# Patient Record
Sex: Female | Born: 1955 | Race: White | Hispanic: No | Marital: Single
Health system: Southern US, Community
[De-identification: ages and names within clinical notes are randomized; demographics above are authoritative.]

## PROBLEM LIST (undated history)

## (undated) DIAGNOSIS — R011 Cardiac murmur, unspecified: Secondary | ICD-10-CM

## (undated) DIAGNOSIS — T7840XA Allergy, unspecified, initial encounter: Secondary | ICD-10-CM

## (undated) DIAGNOSIS — E079 Disorder of thyroid, unspecified: Secondary | ICD-10-CM

## (undated) DIAGNOSIS — E785 Hyperlipidemia, unspecified: Secondary | ICD-10-CM

## (undated) HISTORY — DX: Cardiac murmur, unspecified: R01.1

## (undated) HISTORY — DX: Allergy, unspecified, initial encounter: T78.40XA

## (undated) HISTORY — DX: Hyperlipidemia, unspecified: E78.5

## (undated) HISTORY — DX: Disorder of thyroid, unspecified: E07.9

---

## 2020-07-19 ENCOUNTER — Other Ambulatory Visit: Payer: Self-pay

## 2020-07-19 ENCOUNTER — Ambulatory Visit: Payer: Self-pay | Admitting: Gerontology

## 2020-07-19 ENCOUNTER — Other Ambulatory Visit: Payer: Self-pay | Admitting: Gerontology

## 2020-07-19 ENCOUNTER — Encounter: Payer: Self-pay | Admitting: Gerontology

## 2020-07-19 VITALS — BP 140/62 | HR 74 | Resp 16 | Wt 143.7 lb

## 2020-07-19 DIAGNOSIS — Z7689 Persons encountering health services in other specified circumstances: Secondary | ICD-10-CM

## 2020-07-19 DIAGNOSIS — Z8639 Personal history of other endocrine, nutritional and metabolic disease: Secondary | ICD-10-CM | POA: Insufficient documentation

## 2020-07-19 MED ORDER — LEVOTHYROXINE SODIUM 50 MCG PO TABS: 50.0000 ug | ORAL_TABLET | Freq: Every day | ORAL | 2 refills | Status: DC

## 2020-07-19 MED ORDER — ATORVASTATIN CALCIUM 20 MG PO TABS: 20.0000 mg | ORAL_TABLET | Freq: Every day | ORAL | 2 refills | Status: DC

## 2020-07-19 NOTE — Patient Instructions (Signed)

## 2020-07-19 NOTE — Progress Notes (Signed)
New Patient Office Visit  Subjective:  Patient ID: Karen Savage, female    DOB: [birth year]  Age: 65 y.o. MRN: [id number]  CC: No chief complaint on file.   HPI Karen Savage presents to establish care and evaluation of her chronic condition.  She relocated to [facility] from [place] and has not had medical care since one year.She has a history of Hypothyroidism and takes 50 mcg of Levothyroxine. She denies cold/heat intolerance, constipation and weight gain. She also states that she is Prediabetic and continues to make healthy lifestyle changes. She also has a history of hyperlipidemia and takes 20 mg of Atorvastatin. She has not had mammogram, pap smear no colonoscopy done. She denies chest pain, palpitation, light headedness, and shortness of breath. Overall, she states that she's doing well and offers no further complaint.  Past Medical History:  Diagnosis Date  . Allergy   . Heart murmur   . Hyperlipidemia   . Thyroid disease       No family history on file.  Social History   Socioeconomic History  . Marital status: Single    Spouse name: Not on file  . Number of children: Not on file  . Years of education: Not on file  . Highest education level: Not on file  Occupational History  . Not on file  Tobacco Use  . Smoking status: Not on file  . Smokeless tobacco: Not on file  Substance and Sexual Activity  . Alcohol use: Not on file  . Drug use: Not on file  . Sexual activity: Not on file  Other Topics Concern  . Not on file  Social History Narrative  . Not on file   Social Determinants of Health   Financial Resource Strain: Not on file  Food Insecurity: Not on file  Transportation Needs: Not on file  Physical Activity: Not on file  Stress: Not on file  Social Connections: Not on file  Intimate Partner Violence: Not on file    ROS Review of Systems  Constitutional: Negative.   HENT: Negative.   Eyes: Negative.   Respiratory: Negative.   Cardiovascular: Negative.    Gastrointestinal: Negative.   Endocrine: Negative.   Genitourinary: Negative.   Musculoskeletal: Negative.   Skin: Negative.   Neurological: Negative.   Hematological: Negative.   Psychiatric/Behavioral: Negative.     Objective:   Today's Vitals: BP 140/62 (BP Location: Left Arm, Patient Position: Sitting, Cuff Size: Large)   Pulse 74   Resp 16   Wt 143 lb 11.2 oz (65.2 kg)   SpO2 100%   Physical Exam Constitutional:      Appearance: Normal appearance.  HENT:     Head: Normocephalic and atraumatic.     Nose: Nose normal.     Mouth/Throat:     Mouth: Mucous membranes are moist.  Eyes:     Extraocular Movements: Extraocular movements intact.     Conjunctiva/sclera: Conjunctivae normal.     Pupils: Pupils are equal, round, and reactive to light.  Cardiovascular:     Rate and Rhythm: Normal rate and regular rhythm.     Pulses: Normal pulses.     Heart sounds: Normal heart sounds.  Pulmonary:     Effort: Pulmonary effort is normal.     Breath sounds: Normal breath sounds.  Abdominal:     General: Abdomen is flat. Bowel sounds are normal.     Palpations: Abdomen is soft.  Genitourinary:    Comments: Deferred per patient Musculoskeletal:  General: Normal range of motion.     Cervical back: Normal range of motion.  Skin:    General: Skin is warm.  Neurological:     General: No focal deficit present.     Mental Status: She is alert and oriented to person, place, and time. Mental status is at baseline.  Psychiatric:        Mood and Affect: Mood normal.        Behavior: Behavior normal.        Thought Content: Thought content normal.        Judgment: Judgment normal.     Assessment & Plan:   1. Encounter to establish care -Routine labs will be checked - Comp Met (CMET) - CBC w/Diff - HgB A1c - TSH - Urinalysis - Lipid Profile  2. History of hypothyroidism - She will continue Levothyroxine 50 mcg pending TSH result. - levothyroxine (SYNTHROID) 50 MCG  tablet; Take 1 tablet (50 mcg total) by mouth daily before breakfast.  Dispense: 30 tablet; Refill: 2 - Urinalysis - Lipid Profile  3. History of hyperlipidemia - She will continue current medication pending Lipid pane, was encouraged to continue on low fat/cholesterol diet and exercise as tolerated. - atorvastatin (LIPITOR) 20 MG tablet; Take 1 tablet (20 mg total) by mouth daily.  Dispense: 30 tablet; Refill: 2 - Urinalysis - Lipid Profile     Follow-up: Return in about 15 days (around 08/03/2020), or if symptoms worsen or fail to improve.   Simone Rodenbeck Jerold Coombe, NP

## 2020-07-20 LAB — CBC WITH DIFFERENTIAL/PLATELET
Basophils Absolute: 0 10*3/uL (ref 0.0–0.2)
Basos: 0 %
EOS (ABSOLUTE): 0.1 10*3/uL (ref 0.0–0.4)
Eos: 1 %
Hematocrit: 41.7 % (ref 34.0–46.6)
Hemoglobin: 14.2 g/dL (ref 11.1–15.9)
Immature Grans (Abs): 0 10*3/uL (ref 0.0–0.1)
Immature Granulocytes: 0 %
Lymphocytes Absolute: 1.9 10*3/uL (ref 0.7–3.1)
Lymphs: 29 %
MCH: 32 pg (ref 26.6–33.0)
MCHC: 34.1 g/dL (ref 31.5–35.7)
MCV: 94 fL (ref 79–97)
Monocytes Absolute: 0.5 10*3/uL (ref 0.1–0.9)
Monocytes: 7 %
Neutrophils Absolute: 4.2 10*3/uL (ref 1.4–7.0)
Neutrophils: 63 %
Platelets: 189 10*3/uL (ref 150–450)
RBC: 4.44 x10E6/uL (ref 3.77–5.28)
RDW: 11.3 % — ABNORMAL LOW (ref 11.7–15.4)
WBC: 6.7 10*3/uL (ref 3.4–10.8)

## 2020-07-20 LAB — COMPREHENSIVE METABOLIC PANEL
ALT: 26 IU/L (ref 0–32)
AST: 26 IU/L (ref 0–40)
Albumin/Globulin Ratio: 2 (ref 1.2–2.2)
Albumin: 4.9 g/dL — ABNORMAL HIGH (ref 3.8–4.8)
Alkaline Phosphatase: 61 IU/L (ref 44–121)
BUN/Creatinine Ratio: 14 (ref 12–28)
BUN: 11 mg/dL (ref 8–27)
Bilirubin Total: 0.6 mg/dL (ref 0.0–1.2)
CO2: 20 mmol/L (ref 20–29)
Calcium: 9.6 mg/dL (ref 8.7–10.3)
Chloride: 99 mmol/L (ref 96–106)
Creatinine, Ser: 0.77 mg/dL (ref 0.57–1.00)
Globulin, Total: 2.4 g/dL (ref 1.5–4.5)
Glucose: 121 mg/dL — ABNORMAL HIGH (ref 65–99)
Potassium: 4.1 mmol/L (ref 3.5–5.2)
Sodium: 140 mmol/L (ref 134–144)
Total Protein: 7.3 g/dL (ref 6.0–8.5)
eGFR: 86 mL/min/{1.73_m2} (ref >59)

## 2020-07-20 LAB — LIPID PANEL
Chol/HDL Ratio: 3.2 ratio (ref 0.0–4.4)
Cholesterol, Total: 197 mg/dL (ref 100–199)
HDL: 62 mg/dL (ref >39)
LDL Chol Calc (NIH): 113 mg/dL — ABNORMAL HIGH (ref 0–99)
Triglycerides: 124 mg/dL (ref 0–149)
VLDL Cholesterol Cal: 22 mg/dL (ref 5–40)

## 2020-07-20 LAB — URINALYSIS
Bilirubin, UA: NEGATIVE
Glucose, UA: NEGATIVE
Ketones, UA: NEGATIVE
Leukocytes,UA: NEGATIVE
Nitrite, UA: NEGATIVE
Protein,UA: NEGATIVE
RBC, UA: NEGATIVE
Specific Gravity, UA: <=1.005 — AB (ref 1.005–1.030)
Urobilinogen, Ur: 0.2 mg/dL (ref 0.2–1.0)
pH, UA: 7 (ref 5.0–7.5)

## 2020-07-20 LAB — HEMOGLOBIN A1C
Est. average glucose Bld gHb Est-mCnc: 111 mg/dL
Hgb A1c MFr Bld: 5.5 % (ref 4.8–5.6)

## 2020-07-20 LAB — TSH: TSH: 2.76 u[IU]/mL (ref 0.450–4.500)

## 2020-07-26 ENCOUNTER — Ambulatory Visit: Payer: Self-pay | Admitting: Gerontology

## 2020-08-02 ENCOUNTER — Other Ambulatory Visit: Payer: Self-pay | Admitting: Gerontology

## 2020-08-02 ENCOUNTER — Other Ambulatory Visit: Payer: Self-pay

## 2020-08-02 ENCOUNTER — Ambulatory Visit: Payer: Self-pay | Admitting: Gerontology

## 2020-08-02 VITALS — BP 131/79 | HR 73 | Temp 96.4°F | Resp 16 | Wt 140.9 lb

## 2020-08-02 DIAGNOSIS — Z0000 Encounter for general adult medical examination without abnormal findings: Secondary | ICD-10-CM | POA: Insufficient documentation

## 2020-08-02 DIAGNOSIS — Z8639 Personal history of other endocrine, nutritional and metabolic disease: Secondary | ICD-10-CM

## 2020-08-02 MED ORDER — LEVOTHYROXINE SODIUM 50 MCG PO TABS: 50.0000 ug | ORAL_TABLET | Freq: Every day | ORAL | 1 refills | Status: DC

## 2020-08-02 MED ORDER — ATORVASTATIN CALCIUM 20 MG PO TABS: 20.0000 mg | ORAL_TABLET | Freq: Every day | ORAL | 1 refills | Status: DC

## 2020-08-02 NOTE — Patient Instructions (Signed)

## 2020-08-02 NOTE — Progress Notes (Signed)
Established Patient Office Visit  Subjective:  Patient ID: Karen Savage, female    DOB: 06-02-1955  Age: 65 y.o. MRN: [id number]  CC: F/U visit  HPI Karen Savage  64 y/o female who has a history of hypothyroidism, hyperlipidemia presents for lab review. Her LDL done on 07/19/20 was 113 mg/dl and she continues to take 20 mg Atrovastatin, and the rest of her test was unremarkable. Her TSH was 2.760, she denies constipation, heat/ cold intolerance, chest pain, palpitation and fatigue. Overall, she states that she's doing well and offers no further complaint.  Past Medical History:  Diagnosis Date  . Allergy   . Heart murmur   . Hyperlipidemia   . Thyroid disease       No family history on file.  Social History   Socioeconomic History  . Marital status: Single    Spouse name: Not on file  . Number of children: Not on file  . Years of education: Not on file  . Highest education level: Not on file  Occupational History  . Not on file  Tobacco Use  . Smoking status: Not on file  . Smokeless tobacco: Not on file  Substance and Sexual Activity  . Alcohol use: Not on file  . Drug use: Not on file  . Sexual activity: Not on file  Other Topics Concern  . Not on file  Social History Narrative  . Not on file   Social Determinants of Health   Financial Resource Strain: Not on file  Food Insecurity: Not on file  Transportation Needs: Not on file  Physical Activity: Not on file  Stress: Not on file  Social Connections: Not on file  Intimate Partner Violence: Not on file    Outpatient Medications Prior to Visit  Medication Sig Dispense Refill  . atorvastatin (LIPITOR) 20 MG tablet Take 1 tablet (20 mg total) by mouth daily. 30 tablet 2  . levothyroxine (SYNTHROID) 50 MCG tablet Take 1 tablet (50 mcg total) by mouth daily before breakfast. 30 tablet 2   No facility-administered medications prior to visit.    Allergies  Allergen Reactions  . Penicillins     ROS Review  of Systems  Constitutional: Negative.   Eyes: Negative.   Respiratory: Negative.   Cardiovascular: Negative.   Endocrine: Negative.   Skin: Negative.   Neurological: Negative.   Psychiatric/Behavioral: Negative.       Objective:    Physical Exam HENT:     Head: Normocephalic and atraumatic.  Cardiovascular:     Rate and Rhythm: Normal rate and regular rhythm.     Pulses: Normal pulses.     Heart sounds: Normal heart sounds.  Pulmonary:     Effort: Pulmonary effort is normal.     Breath sounds: Normal breath sounds.  Skin:    General: Skin is warm.  Neurological:     General: No focal deficit present.     Mental Status: She is alert and oriented to person, place, and time. Mental status is at baseline.  Psychiatric:        Mood and Affect: Mood normal.        Behavior: Behavior normal.        Thought Content: Thought content normal.        Judgment: Judgment normal.     BP 131/79 (BP Location: Right Arm, Patient Position: Sitting, Cuff Size: Large)   Pulse 73   Temp (!) 96.4 F (35.8 C)   Resp 16  Wt 140 lb 14.4 oz (63.9 kg)   SpO2 97%  Wt Readings from Last 3 Encounters:  08/02/20 140 lb 14.4 oz (63.9 kg)  07/19/20 143 lb 11.2 oz (65.2 kg)     Health Maintenance Due  Topic Date Due  . Hepatitis C Screening  Never done  . HIV Screening  Never done  . PAP SMEAR-Modifier  Never done  . COLONOSCOPY (Pts 45-88yrs Insurance coverage will need to be confirmed)  Never done  . MAMMOGRAM  Never done    There are no preventive care reminders to display for this patient.  Lab Results  Component Value Date   TSH 2.760 07/19/2020   Lab Results  Component Value Date   WBC 6.7 07/19/2020   HGB 14.2 07/19/2020   HCT 41.7 07/19/2020   MCV 94 07/19/2020   PLT 189 07/19/2020   Lab Results  Component Value Date   NA 140 07/19/2020   K 4.1 07/19/2020   CO2 20 07/19/2020   GLUCOSE 121 (H) 07/19/2020   BUN 11 07/19/2020   CREATININE 0.77 07/19/2020   BILITOT  0.6 07/19/2020   ALKPHOS 61 07/19/2020   AST 26 07/19/2020   ALT 26 07/19/2020   PROT 7.3 07/19/2020   ALBUMIN 4.9 (H) 07/19/2020   CALCIUM 9.6 07/19/2020   Lab Results  Component Value Date   CHOL 197 07/19/2020   Lab Results  Component Value Date   HDL 62 07/19/2020   Lab Results  Component Value Date   LDLCALC 113 (H) 07/19/2020   Lab Results  Component Value Date   TRIG 124 07/19/2020   Lab Results  Component Value Date   CHOLHDL 3.2 07/19/2020   Lab Results  Component Value Date   HGBA1C 5.5 07/19/2020      Assessment & Plan:    1. History of hyperlipidemia - The 10-year ASCVD risk score Denman George DC Jr., et al., 2013) is: 4.9%   Values used to calculate the score:     Age: 29 years     Sex: Female     Is Non-Hispanic African American: No     Diabetic: No     Tobacco smoker: No     Systolic Blood Pressure: 131 mmHg     Is BP treated: No     HDL Cholesterol: 62 mg/dL     Total Cholesterol: 197 mg/dL Her ASCVD risk was 1.6%, she will continue on 20 mg Atorvastatin, low fat/cholesterol diet and exercise as tolerated.   2. History of hypothyroidism -She is Euthyroid and will continue on the same treatment regimen  3. Health care maintenance  - Ambulatory referral to Gastroenterology for Colonoscopy - Ambulatory referral to Ophthalmology    Follow-up: Return in about 6 months (around 02/01/2021), or if symptoms worsen or fail to improve.    Aleph Nickson Trellis Paganini, NP

## 2020-08-09 ENCOUNTER — Other Ambulatory Visit: Payer: Self-pay

## 2020-08-14 ENCOUNTER — Other Ambulatory Visit: Payer: Self-pay

## 2020-08-14 MED FILL — Levothyroxine Sodium Tab 50 MCG: ORAL | 90 days supply | Qty: 90 | Fill #0 | Status: CN

## 2020-08-14 MED FILL — Atorvastatin Calcium Tab 20 MG (Base Equivalent): ORAL | 90 days supply | Qty: 90 | Fill #0 | Status: CN

## 2020-08-16 ENCOUNTER — Other Ambulatory Visit: Payer: Self-pay

## 2020-08-16 MED FILL — Levothyroxine Sodium Tab 50 MCG: ORAL | 90 days supply | Qty: 90 | Fill #0 | Status: AC

## 2020-08-16 MED FILL — Atorvastatin Calcium Tab 20 MG (Base Equivalent): ORAL | 90 days supply | Qty: 90 | Fill #0 | Status: AC

## 2020-08-17 ENCOUNTER — Other Ambulatory Visit: Payer: Self-pay | Admitting: Gerontology

## 2020-08-17 DIAGNOSIS — Z1231 Encounter for screening mammogram for malignant neoplasm of breast: Secondary | ICD-10-CM

## 2020-08-25 ENCOUNTER — Other Ambulatory Visit: Payer: Self-pay

## 2020-08-25 ENCOUNTER — Ambulatory Visit
Admission: RE | Admit: 2020-08-25 | Discharge: 2020-08-25 | Disposition: A | Discharge status: Home / Self Care | Payer: Self-pay | Source: Ambulatory Visit | Attending: Gerontology | Admitting: Gerontology

## 2020-08-25 DIAGNOSIS — Z1231 Encounter for screening mammogram for malignant neoplasm of breast: Secondary | ICD-10-CM | POA: Insufficient documentation

## 2020-09-04 ENCOUNTER — Telehealth: Payer: Self-pay

## 2020-09-04 NOTE — Telephone Encounter (Signed)
Pt has decided not to have a colonoscopy at this time.  She is planning on moving back to [place] soon, and does not want to add any additional expenses.  She is aware of financial assistance and has the application.  She has decided not to have a colonoscopy.  Thanks,  [place], [place]

## 2020-10-16 ENCOUNTER — Ambulatory Visit: Payer: Self-pay | Admitting: Pharmacy Technician

## 2020-10-16 ENCOUNTER — Encounter (INDEPENDENT_AMBULATORY_CARE_PROVIDER_SITE_OTHER): Payer: Self-pay

## 2020-10-16 ENCOUNTER — Other Ambulatory Visit: Payer: Self-pay

## 2020-10-16 DIAGNOSIS — Z79899 Other long term (current) drug therapy: Secondary | ICD-10-CM

## 2020-10-16 NOTE — Progress Notes (Signed)
Patient stated that she was moving to [place].  No longer needs [facility]'s services.  Sherilyn Dacosta Care Manager [facility]

## 2021-02-01 ENCOUNTER — Ambulatory Visit: Payer: Self-pay | Admitting: Gerontology

## 2021-12-01 IMAGING — MG MM DIGITAL SCREENING BILAT W/ TOMO AND CAD
8 series · 8 of 24 positions shown · non-contrast
Comparison: Previous exam(s).

CLINICAL DATA: Screening.

EXAM:
DIGITAL SCREENING BILATERAL MAMMOGRAM WITH TOMOSYNTHESIS AND CAD
TECHNIQUE: Bilateral screening digital craniocaudal and mediolateral oblique
mammograms were obtained. Bilateral screening digital breast
tomosynthesis was performed. The images were evaluated with
computer-aided detection.

[R CC synth-2D]
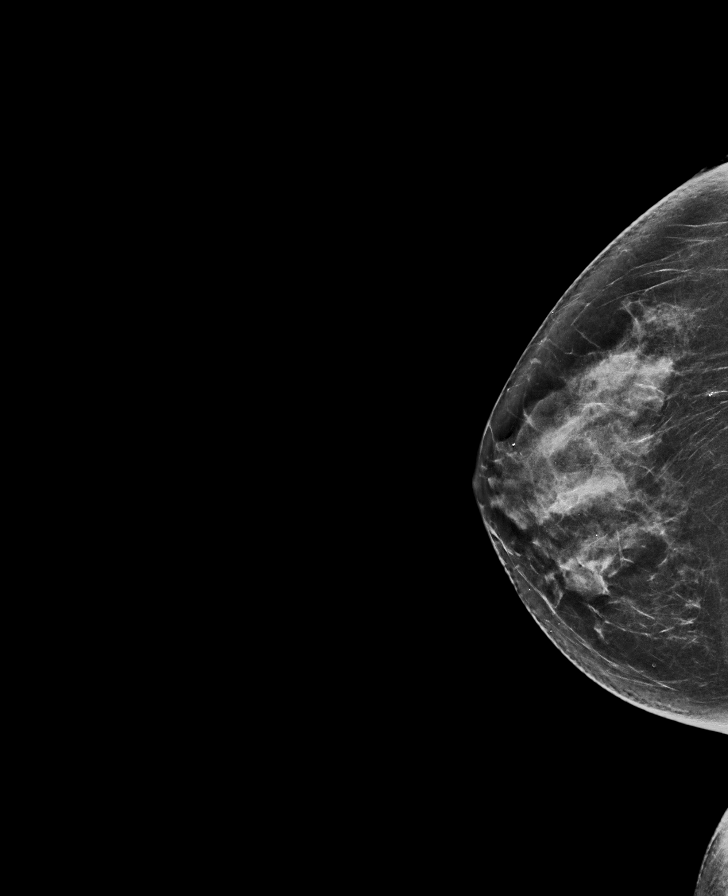

[L CC synth-2D]
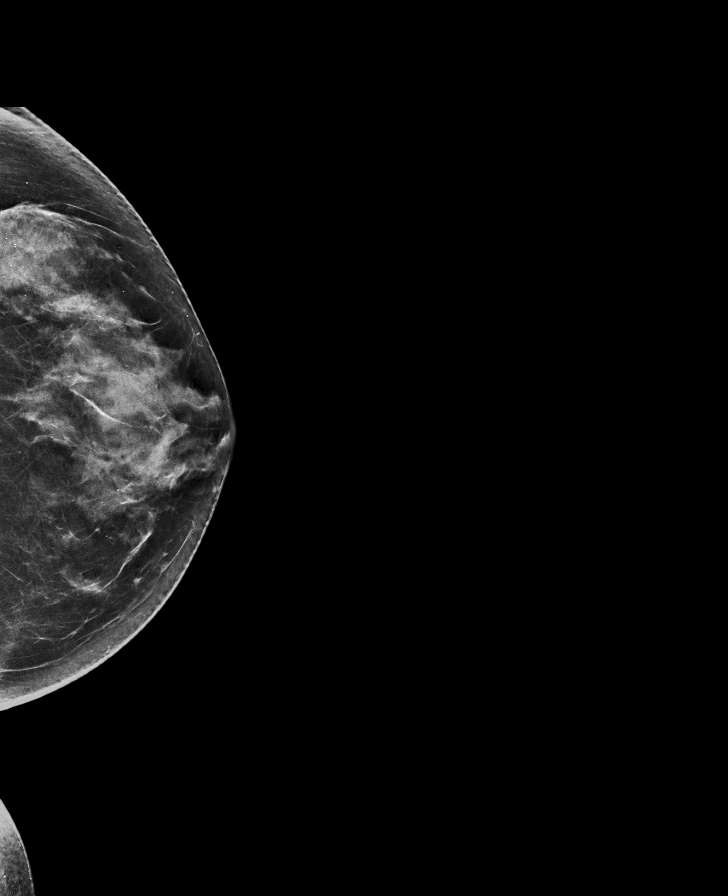

[R MLO synth-2D]
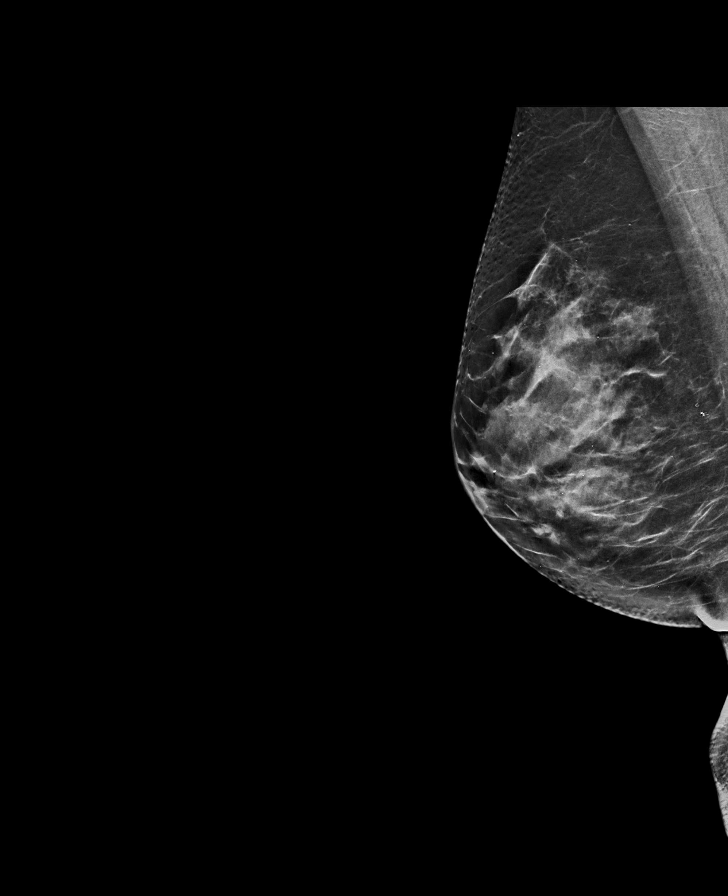

[L MLO synth-2D]
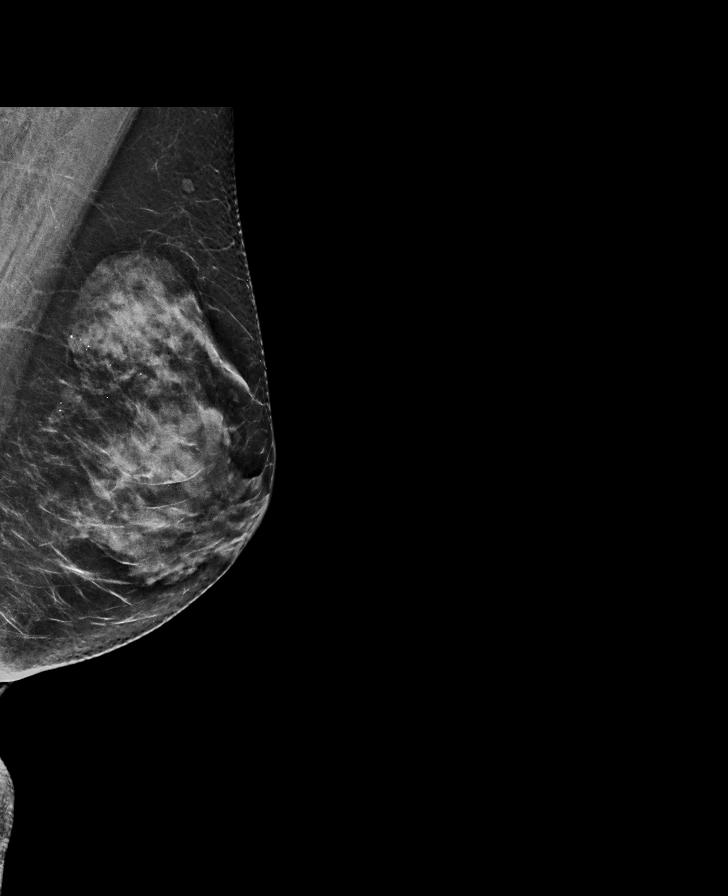

[L MLO tomo · tomo slice 37/72.0]
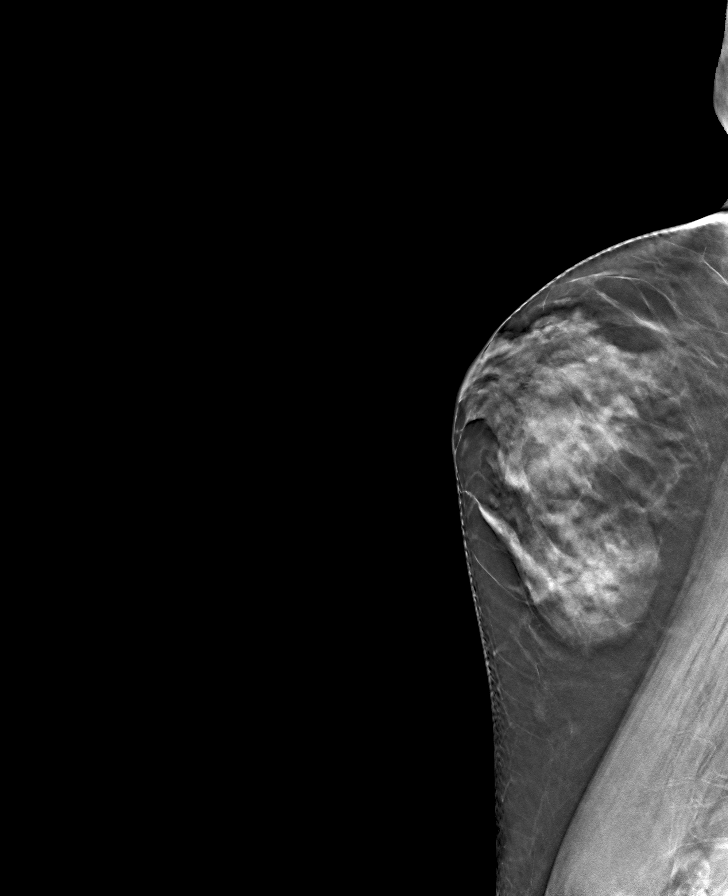

[R CC tomo · tomo slice 36/71.0]
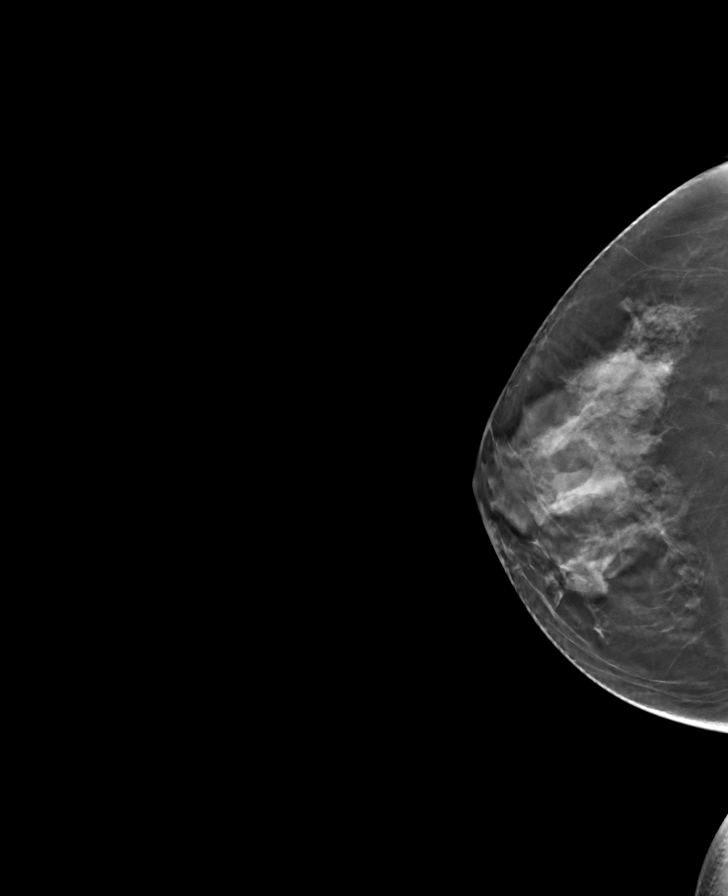

[R MLO tomo · tomo slice 36/71.0]
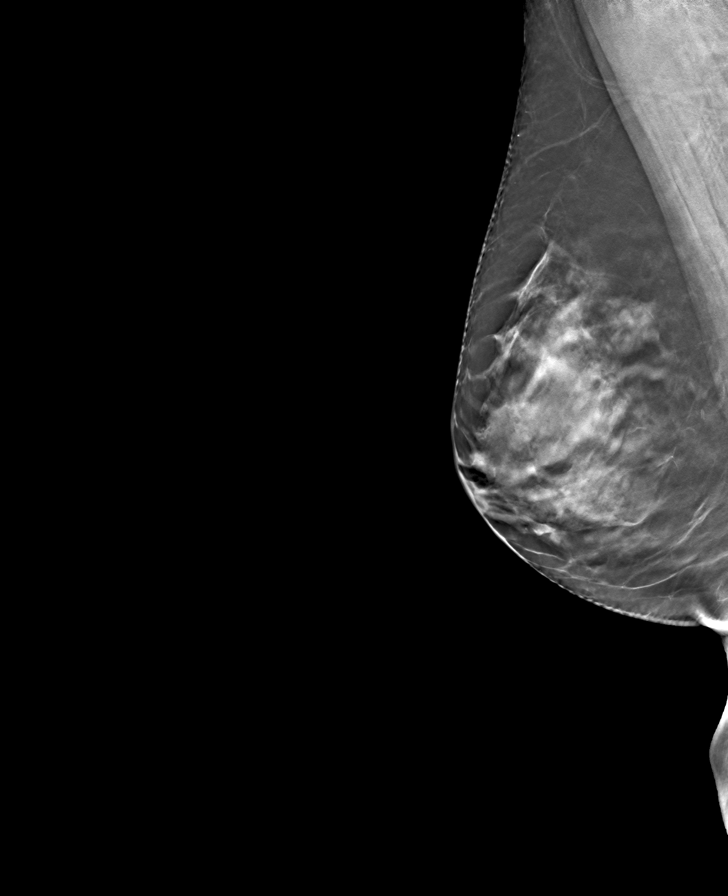

[L CC tomo · tomo slice 35/70.0]
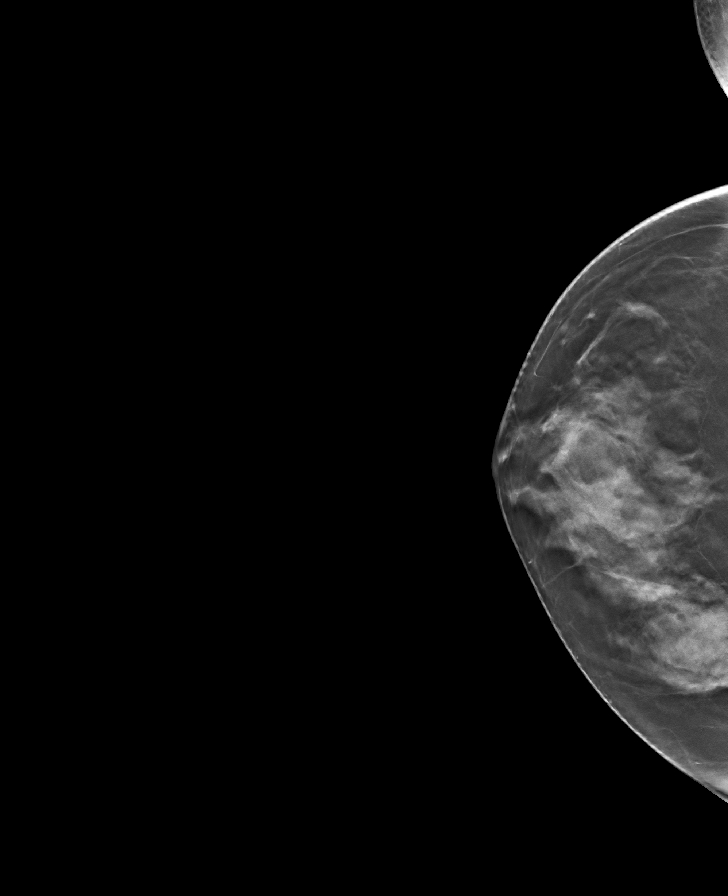

[8 of 24 positions shown; findings below may reference images not displayed]

ACR Breast Density Category c: The breast tissue is heterogeneously
dense, which may obscure small masses.
FINDINGS: There are no findings suspicious for malignancy. The images were
evaluated with computer-aided detection.
IMPRESSION: No mammographic evidence of malignancy. A result letter of this
screening mammogram will be mailed directly to the patient.

RECOMMENDATION:
Screening mammogram in one year. (Code:T4-5-GWO)

BI-RADS CATEGORY  1: Negative.
